# Patient Record
Sex: Female | Born: 1985 | Race: White | Hispanic: No | Marital: Married | Smoking: Current every day smoker
Health system: Southern US, Community
[De-identification: ages and names within clinical notes are randomized; demographics above are authoritative.]

## PROBLEM LIST (undated history)

## (undated) DIAGNOSIS — F603 Borderline personality disorder: Secondary | ICD-10-CM

## (undated) DIAGNOSIS — F319 Bipolar disorder, unspecified: Secondary | ICD-10-CM

## (undated) HISTORY — PX: GALLBLADDER SURGERY: SHX652

## (undated) HISTORY — DX: Borderline personality disorder: F60.3

## (undated) HISTORY — DX: Bipolar disorder, unspecified: F31.9

## (undated) HISTORY — PX: KNEE SURGERY: SHX244

---

## 2005-04-08 ENCOUNTER — Emergency Department (HOSPITAL_COMMUNITY): Admission: EM | Admit: 2005-04-08 | Discharge: 2005-04-09 | Payer: Self-pay | Admitting: Emergency Medicine

## 2009-03-19 ENCOUNTER — Emergency Department (HOSPITAL_COMMUNITY): Admission: EM | Admit: 2009-03-19 | Discharge: 2009-03-19 | Payer: Self-pay | Admitting: Emergency Medicine

## 2009-03-27 ENCOUNTER — Ambulatory Visit (HOSPITAL_COMMUNITY): Admission: RE | Admit: 2009-03-27 | Discharge: 2009-03-27 | Payer: Self-pay | Admitting: Urology

## 2010-11-23 ENCOUNTER — Emergency Department: Payer: Self-pay | Admitting: Emergency Medicine

## 2011-01-17 LAB — URINALYSIS, ROUTINE W REFLEX MICROSCOPIC
Bilirubin Urine: NEGATIVE
Ketones, ur: NEGATIVE mg/dL
Nitrite: POSITIVE — AB
Protein, ur: NEGATIVE mg/dL
pH: 6.5 (ref 5.0–8.0)

## 2011-01-17 LAB — BASIC METABOLIC PANEL
BUN: 12 mg/dL (ref 6–23)
Calcium: 9.4 mg/dL (ref 8.4–10.5)
Creatinine, Ser: 0.77 mg/dL (ref 0.4–1.2)
Glucose, Bld: 122 mg/dL — ABNORMAL HIGH (ref 70–99)
Sodium: 141 mEq/L (ref 135–145)

## 2011-01-17 LAB — URINE MICROSCOPIC-ADD ON

## 2011-01-17 LAB — DIFFERENTIAL
Basophils Absolute: 0.1 10*3/uL (ref 0.0–0.1)
Eosinophils Absolute: 0.6 10*3/uL (ref 0.0–0.7)
Lymphs Abs: 2.5 10*3/uL (ref 0.7–4.0)
Monocytes Absolute: 0.7 10*3/uL (ref 0.1–1.0)
Monocytes Relative: 8 % (ref 3–12)

## 2011-01-17 LAB — CBC
MCV: 84.1 fL (ref 78.0–100.0)
RDW: 13.9 % (ref 11.5–15.5)

## 2011-01-17 LAB — PREGNANCY, URINE: Preg Test, Ur: NEGATIVE

## 2011-01-17 LAB — POCT PREGNANCY, URINE: Preg Test, Ur: NEGATIVE

## 2011-01-17 LAB — URINE CULTURE

## 2011-02-22 NOTE — Op Note (Signed)
Allison Davis, Allison Davis              ACCOUNT NO.:  [id number]   MEDICAL RECORD NO.:  [id number]          PATIENT TYPE:  AMB   LOCATION:  DAY                          FACILITY:  [facility]   PHYSICIAN:  Mark C. Vernie Ammons, M.D.  DATE OF BIRTH:  1985-11-28   DATE OF PROCEDURE:  03/27/2009  DATE OF DISCHARGE:                               OPERATIVE REPORT   PREOPERATIVE DIAGNOSIS:  Right renal calculus.   POSTOPERATIVE DIAGNOSIS:  Right renal calculus.   PROCEDURE:  1. Cystoscopy.  2. Right ureteroscopy.  3. Laser lithotripsy.  4. Ureteroscopic stone extraction.  5. Double-J stent placement.   ANESTHESIA:  General.   SPECIMENS:  Stone given to patient.   DRAINS:  5-French, 24-cm Polaris stent (with string).   BLOOD LOSS:  Minimal.   COMPLICATIONS:  None.   INDICATIONS:  The patient is 25 year old female that was found to have a  right renal pelvic stone.  She has had this for some time and has  undergone lithotripsy of this in the past.  I was consulted via the  emergency room regarding this patient's stone and also found she had a  UTI.  At that time I planned to proceed with surgical treatment once her  UTI was cleared.  Her culture returned positive and she was treated  accordingly per sensitivities and she is brought back to the operating  room for the scheduled surgery that was planned at the time of her  emergency room visit.  The risks, complications, alternatives and  limitations were discussed.  The patient understands and elected to  proceed.   DESCRIPTION OF OPERATION:  After informed consent, the patient was  brought to major OR, placed on table, administered general anesthesia,  then moved to the dorsal lithotomy position.  Genitalia was sterilely  prepped and draped.  An official time-out was then performed.   Initially a 22-French cystoscope was passed into the bladder with a 12  degrees lens.  The bladder was fully inspected and noted to be free of  to any tumor,  stones or inflammatory lesions.  The right orifice was  identified and a 6-French open-end ureteral catheter was passed through  the cystoscope and into the right ureteral orifice.  Through this open-  ended catheter, a 0.038 inch floppy tip guidewire was then passed up the  right ureter under direct fluoroscopic control into the area of the  renal pelvis and the guidewire was left in place with the cystoscope and  ureteral catheter being removed.   The inner portion of the ureteral access sheath was initially passed  over the guidewire and into the area of the renal pelvis under  fluoroscopic control and gentle passage revealed no significant  resistance.  I then inserted the inner cannula inside the outer portion  of the access sheath and passed both of these over the guidewire again  gently passing them into the area of the renal pelvis without  difficulty.  The inner cannula and guidewire were then removed leaving  the access sheath in place.   The 6-French flexible ureteroscope was then passed through the  access  sheath into the kidney.  A systematic inspection of each calix was then  performed and all were found to be free of any stone except a single  upper to mid pole calix.  No tumors or foreign bodies were identified  within the renal  pelvis or collecting system either.   A Escape nitinol basket was then passed through the ureteroscope and I  was  able to grasp the stone and brought it to the area of the  ureteropelvic junction but it was too large to be brought through this  location.  I therefore replaced it back in the upper pole calix and  fragmented into several small fragments using the 200 micron holmium  laser fiber.  I then passed the nitinol basket through the ureteroscope  and individually grasped each of the stone fragments and extracted them  through the access sheath without difficulty.  I reinspected the kidney  and found no further stone fragments present.  I  therefore passed the  guidewire through the ureteroscope and left this in place, removing the  ureteroscope and the access sheath.   The cystoscope was back loaded over the guidewire and the stent was  passed over the guidewire in the area of the renal pelvis.  The  guidewire was removed, good curl was noted in the renal pelvis.  I then  drained the bladder, removed the cystoscope and affixed the string to  the mons pubis.  The patient was awakened and taken to recovery room in  stable and satisfactory condition.  She tolerated the procedure well and  there no intraoperative complications.  She will be maintained on her  current antibiotic regimen until  that is completed and then return to  my office for stent removal.      Mark C. Vernie Ammons, M.D.  Electronically Signed     MCO/MEDQ  D:  03/27/2009  T:  03/27/2009  Job:  [id number]

## 2011-02-22 NOTE — Consult Note (Signed)
Allison Davis, COYNE              ACCOUNT NO.:  [id number]   MEDICAL RECORD NO.:  [id number]          PATIENT TYPE:  EMS   LOCATION:                               FACILITY:  [facility]   PHYSICIAN:  Mark C. Vernie Ammons, M.D.  DATE OF BIRTH:  07-04-1986   DATE OF CONSULTATION:  03/19/2009  DATE OF DISCHARGE:                                 CONSULTATION   Emergency room consult note.   REFERRING PHYSICIAN:  Lorre Nick, M.D.   The patient is a 25 year old female patient seen at the emergency room  physician, Dr. Eliot Ford request.  The patient presented to the emergency  room with flank pain that started earlier today.  It was acute onset and  was constant in nature but persistent.  It was located the right flank  with radiation into the right anterior abdomen and is characterized as  sharp.  It was described as moderate to severe and was not aggravated or  alleviated by positional changes.  She currently denies dysuria, fever,  hematuria, nausea or vomiting.   She has a known history of a right renal calculus and in October of 2009  she had a double-J stent in place and underwent lithotripsy of the right  renal calculus without any significant fragmentation of the stone.  She  has intermittently had some mild flank pain but not severe pain.  She  also has a history of chronic recurrent UTIs that she describes as  occurring about once every month.  She oftentimes will be able to clear  these with Azo-Standard alone.   PAST MEDICAL HISTORY:  Negative for any major medical problems.   SURGICAL HISTORY:  She underwent lithotripsy of her right renal calculus  in 07/2008.  She has also had knee surgery and a splenectomy.   CURRENT MEDICATIONS:  Birth control pills and Percocet.   ALLERGIES:  No known drug allergies.   SOCIAL HISTORY:  Positive for tobacco use, no drug abuse and she  occasionally drinks alcohol.   FAMILY HISTORY:  Negative for GU malignancy or renal disease.   PHYSICAL  EXAMINATION:  Temperature is 97.6, blood pressure is 122/73,  pulse 98.  GENERAL:  The patient is a well-developed, well-nourished white female  who appears in mild discomfort.  HEENT:  Atraumatic, normocephalic.  Oropharynx clear.  NECK:  Supple with midline trachea.  CHEST:  Reveals normal respiratory effort.  CARDIOVASCULAR:  Regular rate and rhythm.  ABDOMEN:  Soft and nontender without mass or HSM.  She had mild right  CVAT to percussion.  EXTREMITIES:  Without clubbing, cyanosis or edema although she does have  multiple tattoos.  Her skin is otherwise warm and dry.  NEUROLOGICALLY:  There are no gross focal neurologic deficits.   REVIEW OF SYSTEMS:  Full 13 point review of systems was negative other  than as noted above.   LABORATORY RESULTS:  White blood cell count 9.1, hemoglobin 13.3,  hematocrit 40.3, platelets 317,000.  Her urinalysis has positive  nitrite, positive leukocyte esterase, too numerous to count white blood  cells, 0-2 red cells and many bacteria.   CT  scan images were independently reviewed.  There is a 7-mm stone in  the right renal pelvis with some slight right renal edema.  The stone  does not appear to be obstructing at this time.   IMPRESSION:  1. She appears to have an acute cystitis at this time.  This needs to      be treated with antibiotics.  2. She has a right renal calculus.  This was previously treated with      lithotripsy unsuccessfully.  We therefore discussed her treatment      options today.  Because she appears to have an acute cystitis at      this time I told her that I would not recommend ureteroscopy and      laser lithotripsy of the stone.  I could place a double-J stent and      then once the infection is clear return for ureteroscopic treatment      of her stone.  The other option, because her stone is      nonobstructing, would be to treat her with antibiotics and bring      her back for ureteroscopy once her urine is clear.   This would      allow me to treat this stone ureteroscopically as well as place a      stent for a shorter period of time since she has been bothered      significantly by the presence of a stent in the past.  She would      like to proceed with that.   PLAN:  1. She will be treated with Cipro 500 mg b.i.d. and I gave her a 2-      week course of that so that she may remain on it prior to her      surgery.  2. She is going to be scheduled for right ureteroscopy and laser      lithotripsy of her right renal pelvic calculus as an outpatient      under general anesthesia with a postoperative stent.  3. If she develops fever, chills or anything to suggest infection      prior to her surgery she will contact me immediately.      Mark C. Vernie Ammons, M.D.  Electronically Signed     MCO/MEDQ  D:  03/19/2009  T:  03/19/2009  Job:  [id number]

## 2011-10-21 DIAGNOSIS — F319 Bipolar disorder, unspecified: Secondary | ICD-10-CM | POA: Insufficient documentation

## 2011-10-21 DIAGNOSIS — F329 Major depressive disorder, single episode, unspecified: Secondary | ICD-10-CM | POA: Insufficient documentation

## 2011-10-21 DIAGNOSIS — F603 Borderline personality disorder: Secondary | ICD-10-CM | POA: Insufficient documentation

## 2011-10-21 DIAGNOSIS — J45909 Unspecified asthma, uncomplicated: Secondary | ICD-10-CM | POA: Insufficient documentation

## 2012-10-20 ENCOUNTER — Emergency Department: Payer: Self-pay | Admitting: Emergency Medicine

## 2012-10-20 LAB — CBC
HCT: 40.5 % (ref 35.0–47.0)
MCHC: 33.1 g/dL (ref 32.0–36.0)
MCV: 83 fL (ref 80–100)
Platelet: 326 10*3/uL (ref 150–440)
RBC: 4.88 10*6/uL (ref 3.80–5.20)
WBC: 12.8 10*3/uL — ABNORMAL HIGH (ref 3.6–11.0)

## 2012-10-20 LAB — URINALYSIS, COMPLETE
Glucose,UR: NEGATIVE mg/dL (ref 0–75)
Ketone: NEGATIVE
Ph: 6 (ref 4.5–8.0)
Protein: 30

## 2012-10-20 LAB — DRUG SCREEN, URINE
Amphetamines, Ur Screen: NEGATIVE (ref ?–1000)
Barbiturates, Ur Screen: NEGATIVE (ref ?–200)
Benzodiazepine, Ur Scrn: NEGATIVE (ref ?–200)
Cannabinoid 50 Ng, Ur ~~LOC~~: NEGATIVE (ref ?–50)
Opiate, Ur Screen: NEGATIVE (ref ?–300)
Tricyclic, Ur Screen: NEGATIVE (ref ?–1000)

## 2012-10-20 LAB — COMPREHENSIVE METABOLIC PANEL
Albumin: 3.8 g/dL (ref 3.4–5.0)
Anion Gap: 11 (ref 7–16)
BUN: 17 mg/dL (ref 7–18)
Chloride: 111 mmol/L — ABNORMAL HIGH (ref 98–107)
Co2: 20 mmol/L — ABNORMAL LOW (ref 21–32)
Creatinine: 1.01 mg/dL (ref 0.60–1.30)
EGFR (African American): >60
Glucose: 88 mg/dL (ref 65–99)
SGOT(AST): 24 U/L (ref 15–37)
SGPT (ALT): 36 U/L (ref 12–78)

## 2012-10-23 LAB — URINE CULTURE

## 2014-03-09 ENCOUNTER — Emergency Department: Payer: Self-pay | Admitting: Internal Medicine

## 2014-09-10 LAB — HM HIV SCREENING LAB: HM HIV Screening: NEGATIVE

## 2017-01-13 LAB — HM PAP SMEAR: HM Pap smear: NEGATIVE

## 2019-07-31 DIAGNOSIS — F603 Borderline personality disorder: Secondary | ICD-10-CM

## 2019-07-31 DIAGNOSIS — Z8742 Personal history of other diseases of the female genital tract: Secondary | ICD-10-CM

## 2019-08-05 ENCOUNTER — Ambulatory Visit: Payer: Self-pay

## 2019-08-09 ENCOUNTER — Other Ambulatory Visit: Payer: Self-pay

## 2019-08-09 ENCOUNTER — Ambulatory Visit (LOCAL_COMMUNITY_HEALTH_CENTER): Payer: Self-pay

## 2019-08-09 ENCOUNTER — Ambulatory Visit (LOCAL_COMMUNITY_HEALTH_CENTER): Payer: Medicaid Other | Admitting: Advanced Practice Midwife

## 2019-08-09 VITALS — BP 107/70 | Ht 66.0 in | Wt 160.0 lb

## 2019-08-09 DIAGNOSIS — Z30013 Encounter for initial prescription of injectable contraceptive: Secondary | ICD-10-CM | POA: Diagnosis not present

## 2019-08-09 DIAGNOSIS — Z23 Encounter for immunization: Secondary | ICD-10-CM

## 2019-08-09 DIAGNOSIS — Z3009 Encounter for other general counseling and advice on contraception: Secondary | ICD-10-CM

## 2019-08-09 MED ORDER — MEDROXYPROGESTERONE ACETATE 150 MG/ML IM SUSP
150.0000 mg | Freq: Once | INTRAMUSCULAR | Status: AC
  Administered 2019-08-09: 150 mg via INTRAMUSCULAR

## 2019-08-09 NOTE — Progress Notes (Signed)
Here today to restart Depo. Declines STD screening. Hal Morales, RN

## 2019-08-09 NOTE — Progress Notes (Signed)
Patient received Flu vaccine while here for FP OV. Hal Morales, RN

## 2019-08-09 NOTE — Progress Notes (Signed)
Pt received Depo 150mg  IM today per provider orders and pt tolerated well. Provider orders completed.Ronny Bacon, RN

## 2019-08-09 NOTE — Progress Notes (Signed)
   [place] problem visit  [place] [facility]  Subjective:  Allison Davis is a 33 y.o.G1P1 smoker being seen today for DMPA reinitiation  Chief Complaint  Patient presents with  . Contraception    Depo    HPI Last DMPA 10/24/18.  LMP 08/05/19.  Last sex 09/09/2016.  Smoker 3/4 ppd.  Last physical 05/03/18.  Last pap 01/26/17 neg  Does the patient have a current or past history of drug use? No   No components found for: HCV]   Health Maintenance Due  Topic Date Due  . HIV Screening  10/14/2000  . TETANUS/TDAP  10/14/2004  . PAP SMEAR-Modifier  10/15/2015  . INFLUENZA VACCINE  05/11/2019    ROS  The following portions of the patient's history were reviewed and updated as appropriate: allergies, current medications, past family history, past medical history, past social history, past surgical history and problem list. Problem list updated.   See flowsheet for other program required questions.  Objective:   Vitals:   08/09/19 1145  BP: 107/70  Weight: 160 lb (72.6 kg)  Height: 5\' 6"  (1.676 m)    Physical Exam  n/a  Assessment and Plan:  JOLI KOOB is a 33 y.o. female presenting to the [facility] Department for a Women's Health problem visit  1. Family planning Wants DMPA  2. Encounter for initial prescription of injectable contraceptive DMPA 150 mg IM x1 today.  Please counsel on need for backup condoms/abstinance next 7 days Needs physical     No follow-ups on file.  No future appointments.  Herbie Saxon, CNM

## 2019-10-09 ENCOUNTER — Ambulatory Visit: Payer: Medicaid Other | Attending: Internal Medicine

## 2019-10-09 DIAGNOSIS — Z20822 Contact with and (suspected) exposure to covid-19: Secondary | ICD-10-CM

## 2019-10-10 LAB — NOVEL CORONAVIRUS, NAA: SARS-CoV-2, NAA: NOT DETECTED

## 2019-12-10 ENCOUNTER — Ambulatory Visit: Payer: Medicaid Other

## 2020-03-11 ENCOUNTER — Ambulatory Visit (INDEPENDENT_AMBULATORY_CARE_PROVIDER_SITE_OTHER): Payer: Self-pay | Admitting: Family Medicine

## 2020-03-11 ENCOUNTER — Other Ambulatory Visit: Payer: Self-pay

## 2020-03-11 ENCOUNTER — Other Ambulatory Visit: Payer: Self-pay | Admitting: Family Medicine

## 2020-03-11 ENCOUNTER — Encounter: Payer: Self-pay | Admitting: Family Medicine

## 2020-03-11 VITALS — BP 114/43 | HR 72 | Temp 97.8°F | Ht 63.5 in | Wt 154.6 lb

## 2020-03-11 DIAGNOSIS — Z8041 Family history of malignant neoplasm of ovary: Secondary | ICD-10-CM

## 2020-03-11 DIAGNOSIS — N6310 Unspecified lump in the right breast, unspecified quadrant: Secondary | ICD-10-CM

## 2020-03-11 DIAGNOSIS — R3129 Other microscopic hematuria: Secondary | ICD-10-CM

## 2020-03-11 DIAGNOSIS — Z0000 Encounter for general adult medical examination without abnormal findings: Secondary | ICD-10-CM

## 2020-03-11 DIAGNOSIS — Z1329 Encounter for screening for other suspected endocrine disorder: Secondary | ICD-10-CM

## 2020-03-11 DIAGNOSIS — Z13220 Encounter for screening for lipoid disorders: Secondary | ICD-10-CM

## 2020-03-11 DIAGNOSIS — N644 Mastodynia: Secondary | ICD-10-CM

## 2020-03-11 DIAGNOSIS — Z803 Family history of malignant neoplasm of breast: Secondary | ICD-10-CM

## 2020-03-11 DIAGNOSIS — Z7689 Persons encountering health services in other specified circumstances: Secondary | ICD-10-CM | POA: Insufficient documentation

## 2020-03-11 LAB — POCT URINALYSIS DIPSTICK
Bilirubin, UA: NEGATIVE
Blood, UA: NEGATIVE
Glucose, UA: NEGATIVE
Ketones, UA: NEGATIVE
Leukocytes, UA: NEGATIVE
Nitrite, UA: NEGATIVE
Protein, UA: NEGATIVE
Spec Grav, UA: <=1.005 — AB (ref 1.010–1.025)
Urobilinogen, UA: 0.2 E.U./dL
pH, UA: 7 (ref 5.0–8.0)

## 2020-03-11 LAB — POCT URINE PREGNANCY: Preg Test, Ur: NEGATIVE

## 2020-03-11 NOTE — Progress Notes (Signed)
Updated order code for mammo

## 2020-03-11 NOTE — Assessment & Plan Note (Signed)
New patient establishment with [facility] on 03/11/2020.  With concerns for right breast mass with bilateral breast pain x 2-3 months.  Reports family history of ovarian and breast cancer, denies any past history of genetic testing.  Upon wrapping up of visit and ordering tests/labs, patient with family planning medicaid.  Discussed with patient options including [facility] and provided with contact information.  Discussed if does not qualify for this clinic to please let us know and I will request assistance with our social work team to get care coordinated and if needs assistance with updating medicaid card.  Plan: 1. Diagnostic mammogram, and bilateral ultrasounds ordered 2. Labs ordered for evaluation. 3. Referral to OBGYN placed to establish care 4. To return to clinic in 4 weeks

## 2020-03-11 NOTE — Patient Instructions (Addendum)
I have put in orders for a diagnostic mammogram as well as a right and left breast ultrasound for evaluation of your right breast mass.    Call the Imaging Center below anytime to schedule your own appointment now that order has been placed.  [facility] [address] [place], [place] [address] Phone: [phone]  [facility] [address] [place], [place] [address] Phone: [phone]  Be sure to contact your Medicaid social worker to update your medicaid card, not having this updated may cause a delay in scheduling of appointments.  Can contact [facility] at [address], [place], [place] [phone]  Have your labs drawn in the next 1-2 weeks and I will contact you with the results to discuss.  I have put in a referral to OBGYN for you to establish for well woman care given your family history of ovarian and breast cancer.  If you have not heard from them within 1 week to please contact our office and I will reach out to the referral coordinator for an update.  We will plan to see you back in 4 weeks for breast discomfort/mass follow up  You will receive a survey after today's visit either digitally by e-mail or paper by USPS mail. Your experiences and feedback matter to [place].  Please respond so we know how we are doing as we provide care for you.  Call us with any questions/concerns/needs.  It is my goal to be available to you for your health concerns.  Thanks for choosing me to be a partner in your healthcare needs!  Charlaine Dalton, FNP-C Family Nurse Practitioner [facility] Phone: [phone]

## 2020-03-11 NOTE — Progress Notes (Signed)
Subjective:    Patient ID: Allison Davis, female    DOB: [birth year], 34 y.o.   MRN: [id number]  Allison Davis is a 34 y.o. female presenting on 03/11/2020 for Establish Care (bilateral soreness and pain both breast x 2-3  mths. The pain worsen around the areola .)   HPI  Reports no history of establishment with a primary care provider in the past.  Records unable to be requested.  Has acute concerns today for bilateral breast pain/discomfort with right breast mass.  States has remained constant over the past 2-3 months.  Had recently followed with [facility] with her last visit in October 2020 having received depo-provera.  Reports that she has not followed up with them and has been having spotting without full menses.  Denies any concerns for pregnancy.  Denies galactorrhea, skin changes, worsening of right breast mass since initially finding it 2-3 months ago.  No flowsheet data found.  Past Medical History:  Diagnosis Date  . Bipolar disorder (HCC)   . Borderline personality disorder [facility])    Past Surgical History:  Procedure Laterality Date  . GALLBLADDER SURGERY    . KNEE SURGERY Left    Social History   Socioeconomic History  . Marital status: Married    Spouse name: Not on file  . Number of children: Not on file  . Years of education: Not on file  . Highest education level: Not on file  Occupational History  . Not on file  Tobacco Use  . Smoking status: Current Every Day Smoker    Packs/day: 1.00    Years: 20.00    Pack years: 20.00  . Smokeless tobacco: Never Used  Substance and Sexual Activity  . Alcohol use: Never  . Drug use: Never  . Sexual activity: Not on file  Other Topics Concern  . Not on file  Social History Narrative  . Not on file   Social Determinants of Health   Financial Resource Strain:   . Difficulty of Paying Living Expenses:   Food Insecurity:   . Worried About Programme researcher, broadcasting/film/video in the Last Year:   . Occupational psychologist in the Last Year:   Transportation Needs:   . Freight forwarder (Medical):   Marland Kitchen Lack of Transportation (Non-Medical):   Physical Activity:   . Days of Exercise per Week:   . Minutes of Exercise per Session:   Stress:   . Feeling of Stress :   Social Connections:   . Frequency of Communication with Friends and Family:   . Frequency of Social Gatherings with Friends and Family:   . Attends Religious Services:   . Active Member of Clubs or Organizations:   . Attends Banker Meetings:   Marland Kitchen Marital Status:   Intimate Partner Violence:   . Fear of Current or Ex-Partner:   . Emotionally Abused:   Marland Kitchen Physically Abused:   . Sexually Abused:    Family History  Problem Relation Age of Onset  . Cancer Mother        ovarian cancer  . ADD / ADHD Son   . Hypertension Maternal Grandmother   . Lung cancer Maternal Grandfather   . Lung cancer Maternal Uncle   . Breast cancer Maternal Aunt        Maternal GREAT Aunt    No current outpatient medications on file prior to visit.   No current facility-administered medications on file prior to visit.  Per HPI unless specifically indicated above     Objective:    BP (!) 114/43 (BP Location: Right Arm, Patient Position: Sitting, Cuff Size: Normal)   Pulse 72   Temp 97.8 F (36.6 C) (Temporal)   Ht 5' 3.5" (1.613 m)   Wt 154 lb 9.6 oz (70.1 kg)   BMI 26.96 kg/m   Wt Readings from Last 3 Encounters:  03/11/20 154 lb 9.6 oz (70.1 kg)  08/09/19 160 lb (72.6 kg)  10/24/18 170 lb (77.1 kg)  Physical Exam Vitals reviewed.  Constitutional:      General: She is not in acute distress.    Appearance: Normal appearance. She is well-developed, well-groomed and overweight. She is not ill-appearing or toxic-appearing.  HENT:     Head: Normocephalic.     Nose:     Comments: Lesia Sago is in place, covering mouth and nose  Eyes:     General: Lids are normal. Vision grossly intact.        Right eye: No discharge.          Left eye: No discharge.     Extraocular Movements: Extraocular movements intact.     Conjunctiva/sclera: Conjunctivae normal.     Pupils: Pupils are equal, round, and reactive to light.  Cardiovascular:     Rate and Rhythm: Normal rate and regular rhythm.     Pulses: Normal pulses.     Heart sounds: Normal heart sounds. No murmur. No friction rub. No gallop.   Pulmonary:     Effort: Pulmonary effort is normal. No respiratory distress.     Breath sounds: Normal breath sounds.  Chest:     Chest wall: No mass.     Breasts: Breasts are symmetrical.        Right: Mass present. No swelling, bleeding, inverted nipple, nipple discharge, skin change or tenderness.        Left: Normal.    Musculoskeletal:     Right lower leg: No edema.     Left lower leg: No edema.  Lymphadenopathy:     Upper Body:     Right upper body: No supraclavicular or axillary adenopathy.     Left upper body: No supraclavicular or axillary adenopathy.  Skin:    General: Skin is warm and dry.     Capillary Refill: Capillary refill takes less than 2 seconds.  Neurological:     General: No focal deficit present.     Mental Status: She is alert and oriented to person, place, and time.     Cranial Nerves: No cranial nerve deficit.     Sensory: No sensory deficit.     Motor: No weakness.     Coordination: Coordination normal.     Gait: Gait normal.  Psychiatric:        Attention and Perception: Attention and perception normal.        Mood and Affect: Mood and affect normal.        Speech: Speech normal.        Behavior: Behavior normal. Behavior is cooperative.        Thought Content: Thought content normal.        Cognition and Memory: Cognition and memory normal.        Judgment: Judgment normal.     Results for orders placed or performed in visit on 03/11/20  POCT urine pregnancy  Result Value Ref Range   Preg Test, Ur Negative Negative  POCT Urinalysis Dipstick  Result Value Ref Range  Color, UA  Yellow    Clarity, UA clear    Glucose, UA Negative Negative   Bilirubin, UA negative    Ketones, UA negative    Spec Grav, UA <=1.005 (A) 1.010 - 1.025   Blood, UA negative    pH, UA 7.0 5.0 - 8.0   Protein, UA Negative Negative   Urobilinogen, UA 0.2 0.2 or 1.0 E.U./dL   Nitrite, UA negative    Leukocytes, UA Negative Negative   Appearance     Odor        Assessment & Plan:   Problem List Items Addressed This Visit      Other   Breast mass, right   Relevant Orders   US BREAST LTD UNI RIGHT INC AXILLA   MM Digital Diagnostic Bilat   Ambulatory referral to Obstetrics / Gynecology   Encounter to establish care with new doctor    New patient establishment with [facility] on 03/11/2020.  With concerns for right breast mass with bilateral breast pain x 2-3 months.  Reports family history of ovarian and breast cancer, denies any past history of genetic testing.  Upon wrapping up of visit and ordering tests/labs, patient with family planning medicaid.  Discussed with patient options including [facility] and provided with contact information.  Discussed if does not qualify for this clinic to please let us know and I will request assistance with our social work team to get care coordinated and if needs assistance with updating medicaid card.  Plan: 1. Diagnostic mammogram, and bilateral ultrasounds ordered 2. Labs ordered for evaluation. 3. Referral to OBGYN placed to establish care 4. To return to clinic in 4 weeks       Other Visit Diagnoses    Breast tenderness in female    -  Primary   Relevant Orders   POCT urine pregnancy (Completed)   US BREAST LTD UNI RIGHT INC AXILLA   US BREAST LTD UNI LEFT INC AXILLA   MM Digital Diagnostic Bilat   Ambulatory referral to Obstetrics / Gynecology   Routine medical exam       Relevant Orders   POCT Urinalysis Dipstick (Completed)   CBC with Differential   COMPLETE METABOLIC PANEL WITH GFR   Encounter for  screening for lipid disorder       Relevant Orders   Lipid Profile   Screening for thyroid disorder       Relevant Orders   Thyroid Panel With TSH   Family history of malignant neoplasm of female breast       Relevant Orders   Ambulatory referral to Obstetrics / Gynecology   Family history of ovarian cancer       Relevant Orders   Ambulatory referral to Obstetrics / Gynecology   Microscopic hematuria       Relevant Orders   Urinalysis, microscopic only      No orders of the defined types were placed in this encounter.     Follow up plan: Return in about 4 weeks (around 04/08/2020) for Breast discomfort follow up.  Harlin Rain, FNP-C Family Nurse Practitioner [facility] 03/11/2020, 11:52 AM

## 2020-03-16 ENCOUNTER — Other Ambulatory Visit: Payer: Medicaid Other

## 2020-03-23 ENCOUNTER — Ambulatory Visit
Admission: RE | Admit: 2020-03-23 | Discharge: 2020-03-23 | Disposition: A | Discharge status: Home / Self Care | Payer: Medicaid Other | Source: Ambulatory Visit | Attending: Family Medicine | Admitting: Family Medicine

## 2020-03-23 DIAGNOSIS — N6310 Unspecified lump in the right breast, unspecified quadrant: Secondary | ICD-10-CM

## 2020-03-23 DIAGNOSIS — N644 Mastodynia: Secondary | ICD-10-CM | POA: Insufficient documentation

## 2020-03-31 ENCOUNTER — Encounter: Payer: Self-pay | Admitting: Obstetrics and Gynecology

## 2020-04-08 ENCOUNTER — Ambulatory Visit: Payer: Medicaid Other | Admitting: Family Medicine

## 2020-08-04 ENCOUNTER — Other Ambulatory Visit: Payer: Self-pay

## 2020-08-04 ENCOUNTER — Ambulatory Visit (LOCAL_COMMUNITY_HEALTH_CENTER): Payer: Medicaid Other

## 2020-08-04 DIAGNOSIS — Z23 Encounter for immunization: Secondary | ICD-10-CM

## 2020-08-04 NOTE — Progress Notes (Signed)
Per client, had "shot for whooping cough" before she left the hospital 05/2011 following birth of her last child. Jossie Ng, RN

## 2020-10-08 ENCOUNTER — Ambulatory Visit: Payer: Medicaid Other

## 2020-10-21 ENCOUNTER — Ambulatory Visit: Payer: Medicaid Other

## 2020-10-22 ENCOUNTER — Ambulatory Visit: Payer: Medicaid Other

## 2020-11-05 ENCOUNTER — Ambulatory Visit: Payer: Medicaid Other

## 2021-11-20 IMAGING — US US BREAST*R* LIMITED INC AXILLA
1 series · 4 of 4 positions shown · non-contrast
Comparison: None.

CLINICAL DATA: Patient presents for evaluation of palpable
abnormalities within the right breast.

EXAM:
DIGITAL DIAGNOSTIC BILATERAL MAMMOGRAM WITH CAD AND TOMO
ULTRASOUND BILATERAL BREAST

[Series 1: us breast*right* limited inc axilla · 0.06mm/px · 4 of 4 slices shown]
[im 1/4]
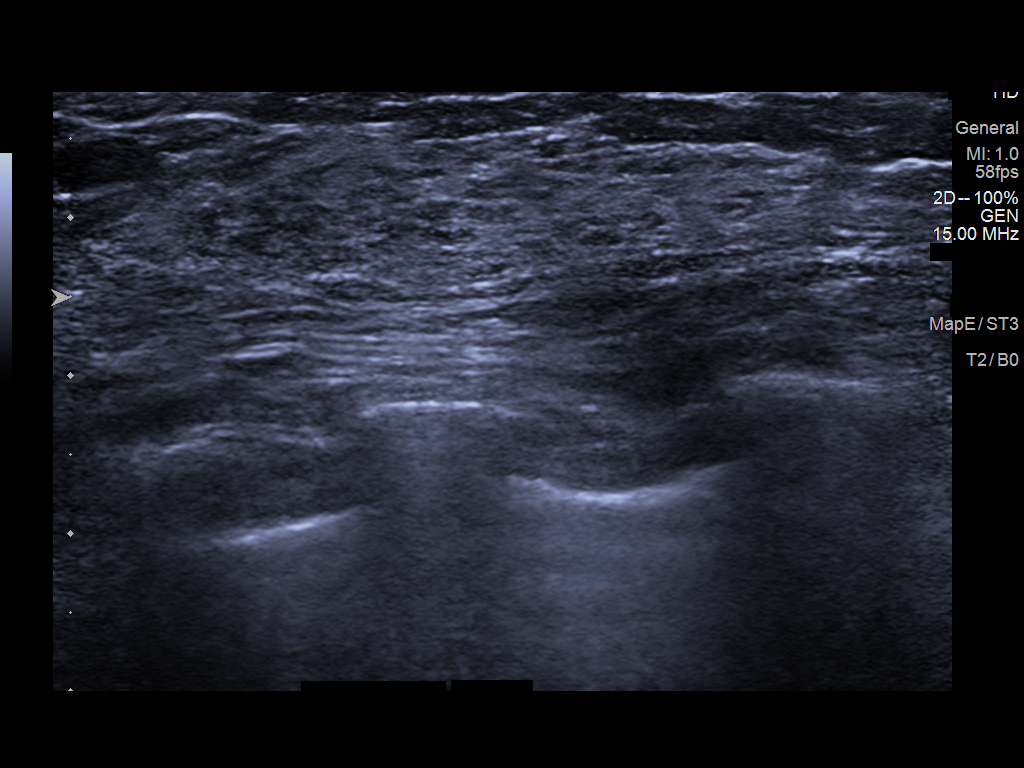
[im 2/4]
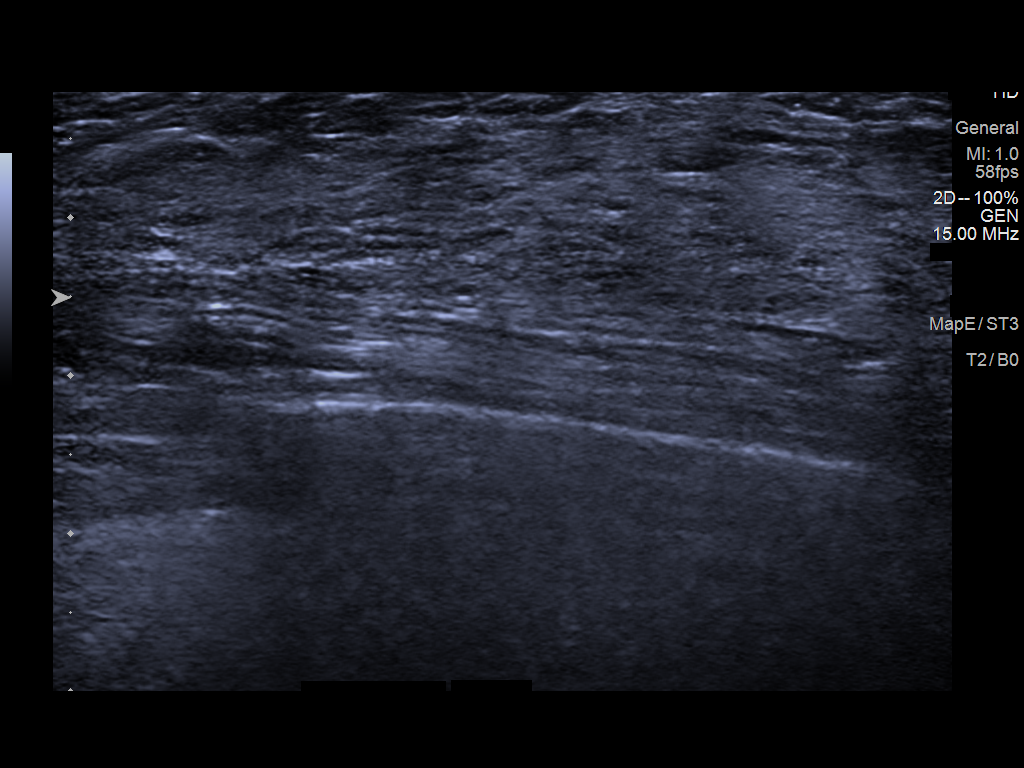
[im 3/4]
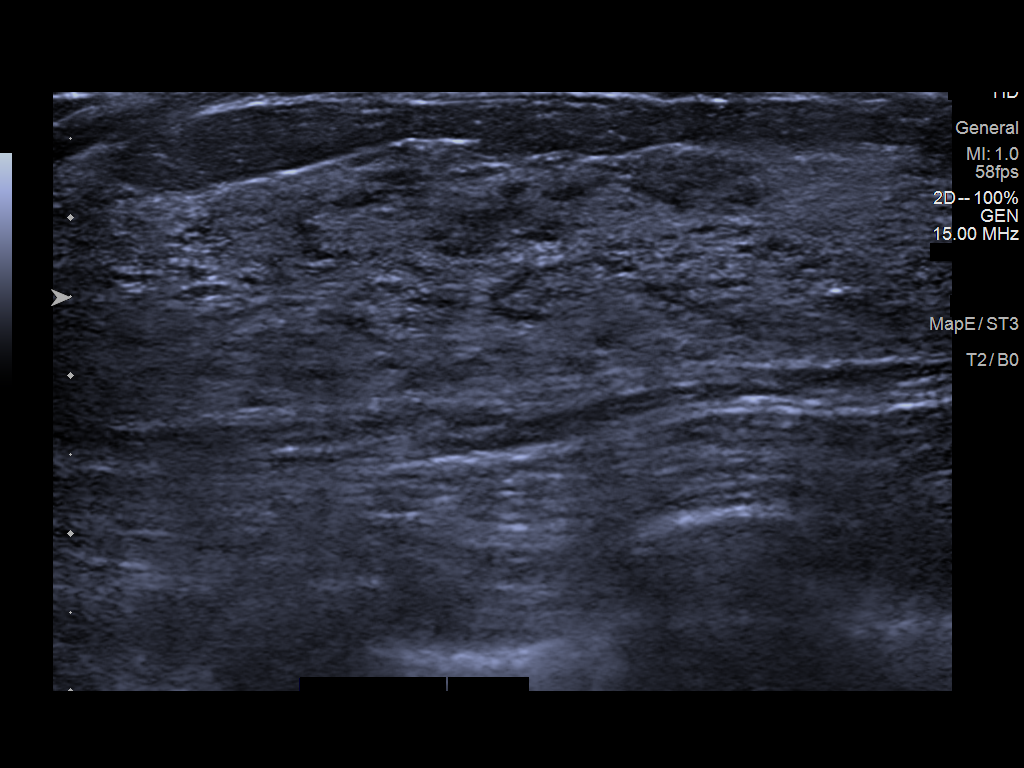
[im 4/4]
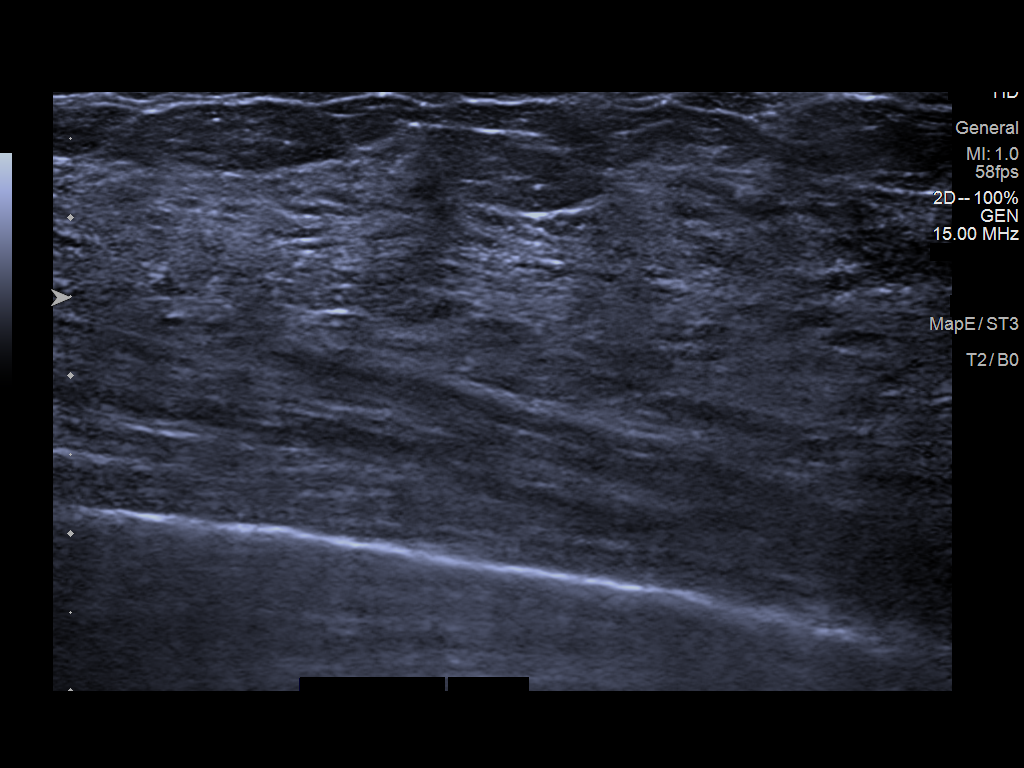

[4 of 4 positions shown; findings below may reference images not displayed]

ACR Breast Density Category c: The breast tissue is heterogeneously
dense, which may obscure small masses.
FINDINGS: No concerning masses, calcifications or distortion identified within
the right breast.

Within the upper-outer left breast near the left axilla there is an
oval mass.

Mammographic images were processed with CAD.

Targeted ultrasound is performed, showing normal dense tissue
without suspicious mass at the site of palpable concern right breast
10 o'clock position 6 cm from nipple and right breast 10:30 o'clock
1 cm from nipple.

Normal appearing lymph nodes within the left axilla.
IMPRESSION: No mammographic evidence for malignancy.

No suspicious abnormality at the sites of palpable concern right
breast.

RECOMMENDATION:
Continued clinical evaluation for right breast palpable areas of
concern.

Return to annual screening mammography at age 40.

I have discussed the findings and recommendations with the patient.
If applicable, a reminder letter will be sent to the patient
regarding the next appointment.

BI-RADS CATEGORY  2: Benign.

## 2021-11-20 IMAGING — US US BREAST*L* LIMITED INC AXILLA
2 series · 8 of 8 positions shown · non-contrast
Comparison: None.

CLINICAL DATA: Patient presents for evaluation of palpable
abnormalities within the right breast.

EXAM:
DIGITAL DIAGNOSTIC BILATERAL MAMMOGRAM WITH CAD AND TOMO
ULTRASOUND BILATERAL BREAST

[Series 1: us breast*left* limited inc axilla · 0.06mm/px · 6 of 6 slices shown (1 of 2)]
[im 1/6]
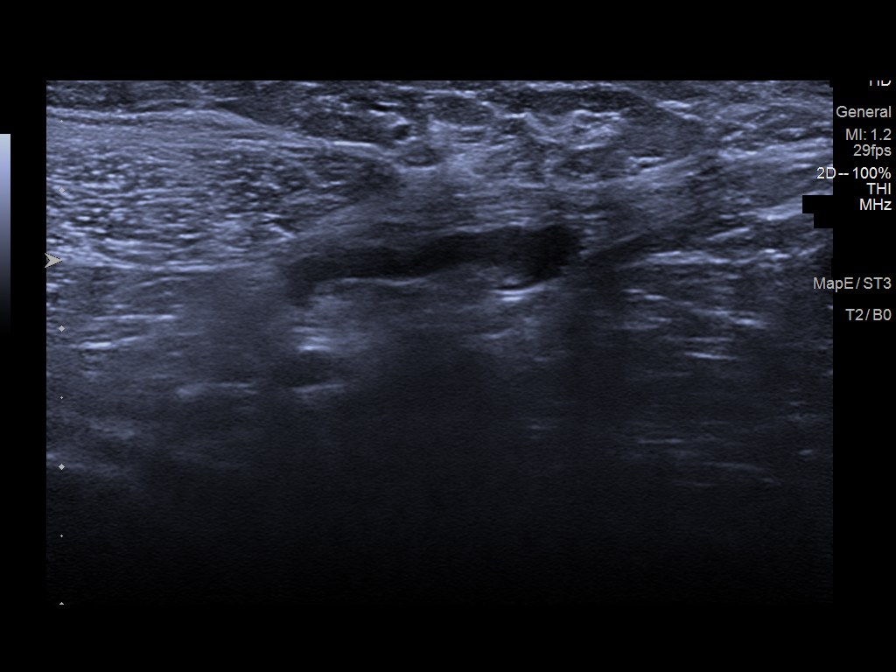
[im 2/6]
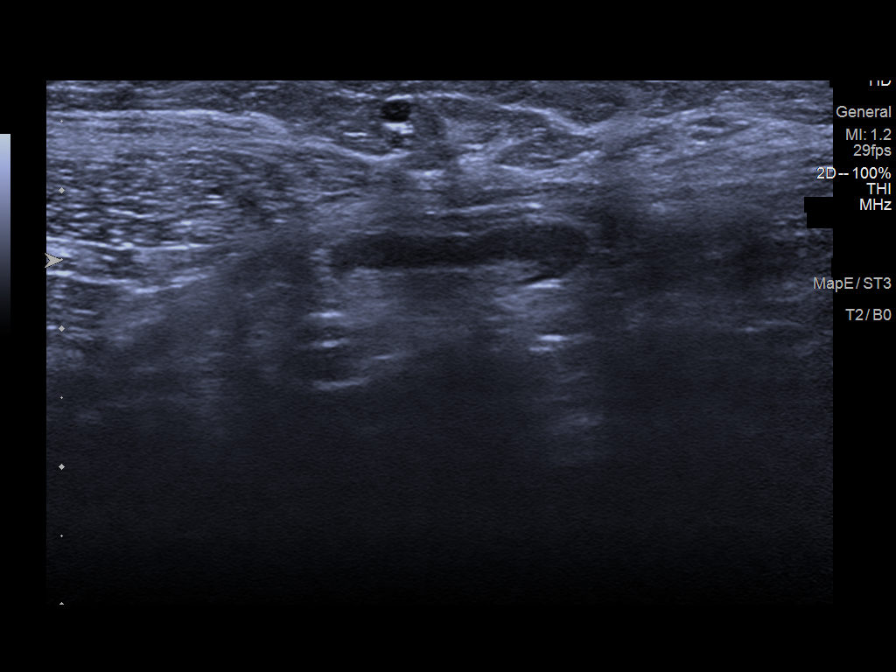
[im 3/6]
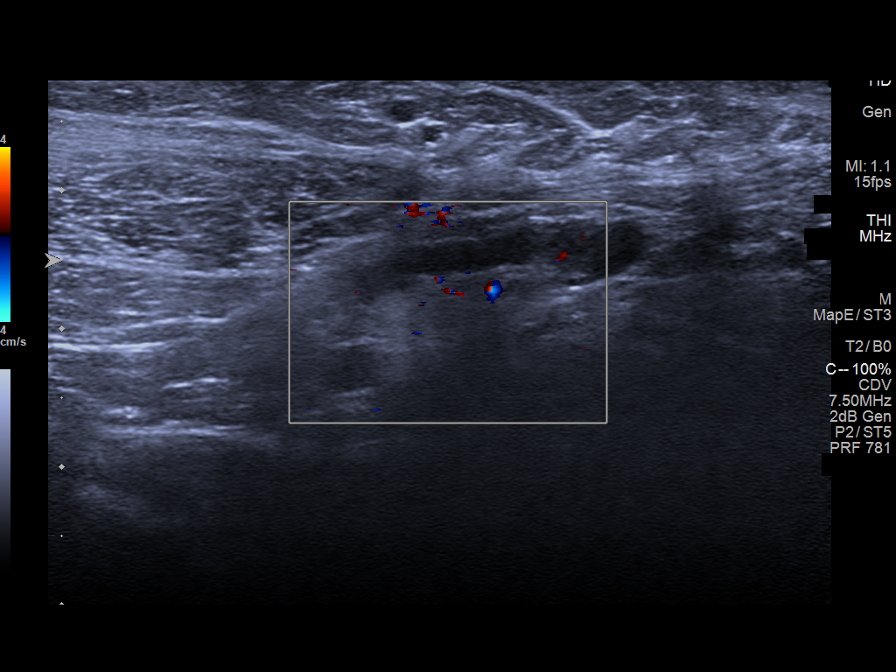
[im 4/6]
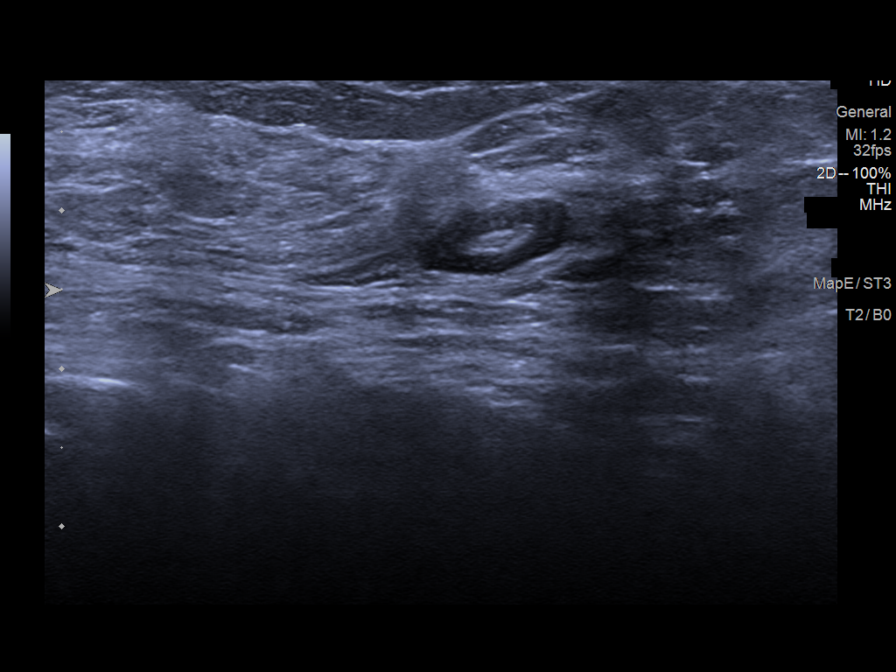
[im 5/6]
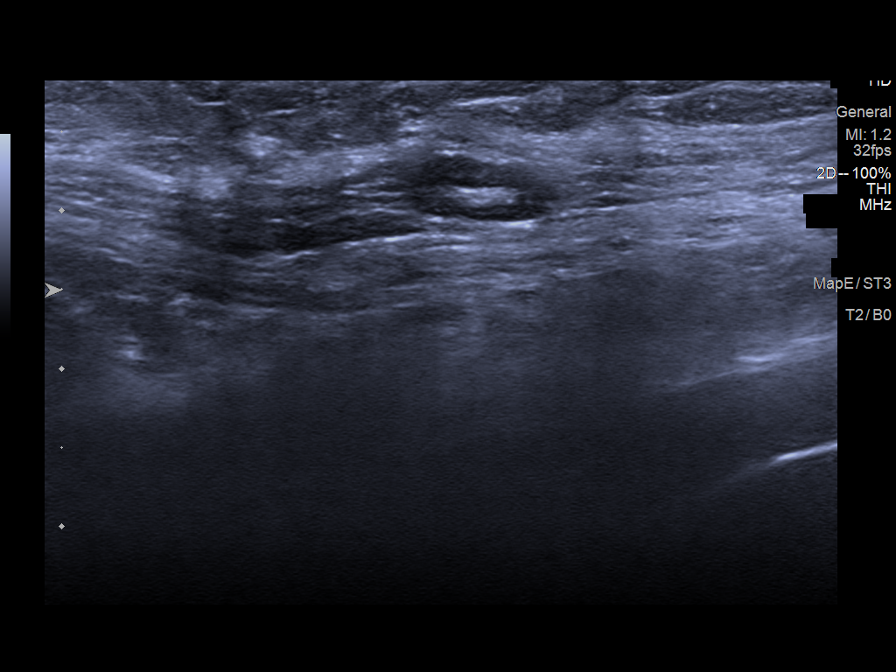
[im 6/6]
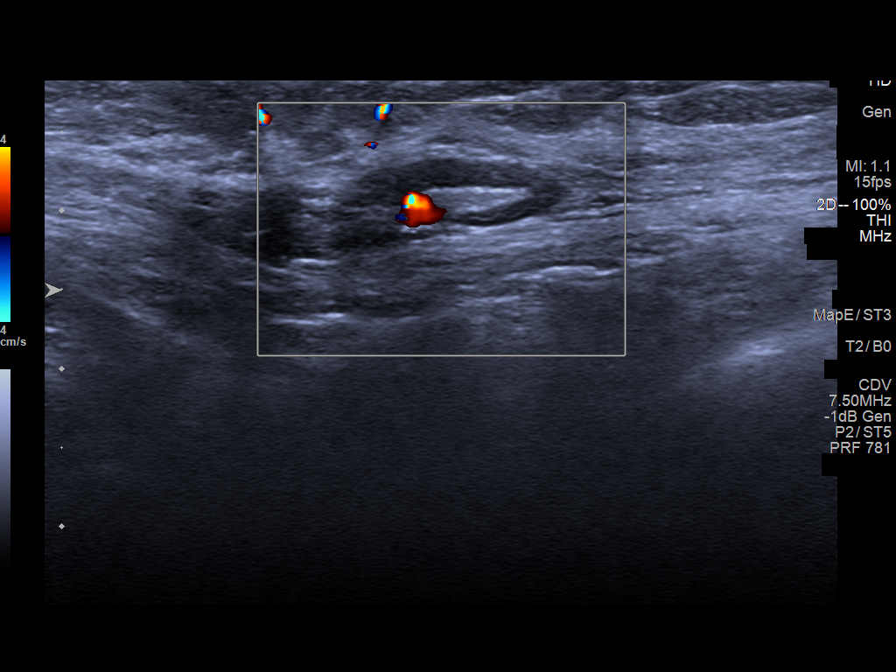

[Series 2: us breast*left* limited inc axilla · 0.06mm/px · 2 of 2 slices shown (2 of 2)]
[im 1/2]
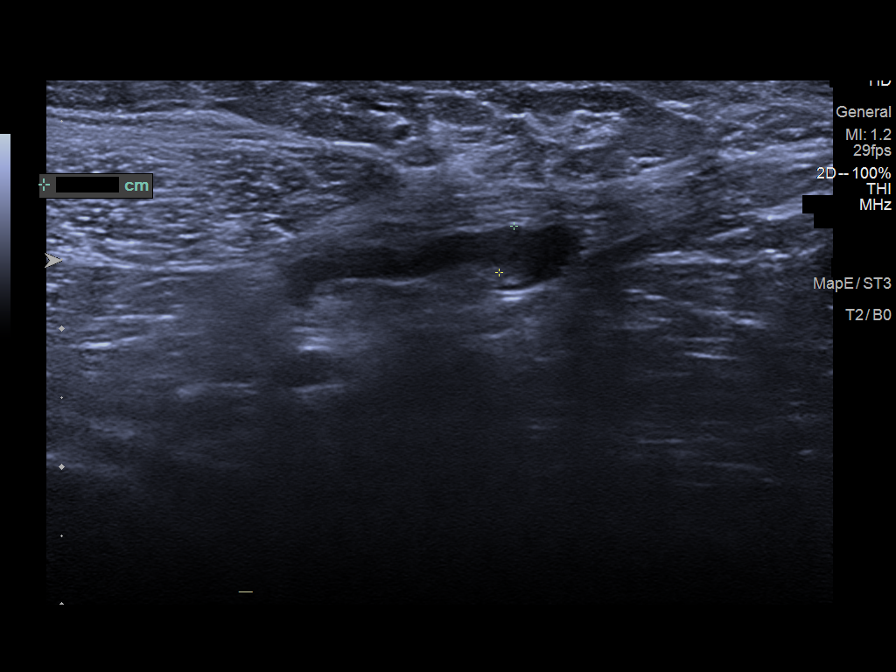
[im 2/2]
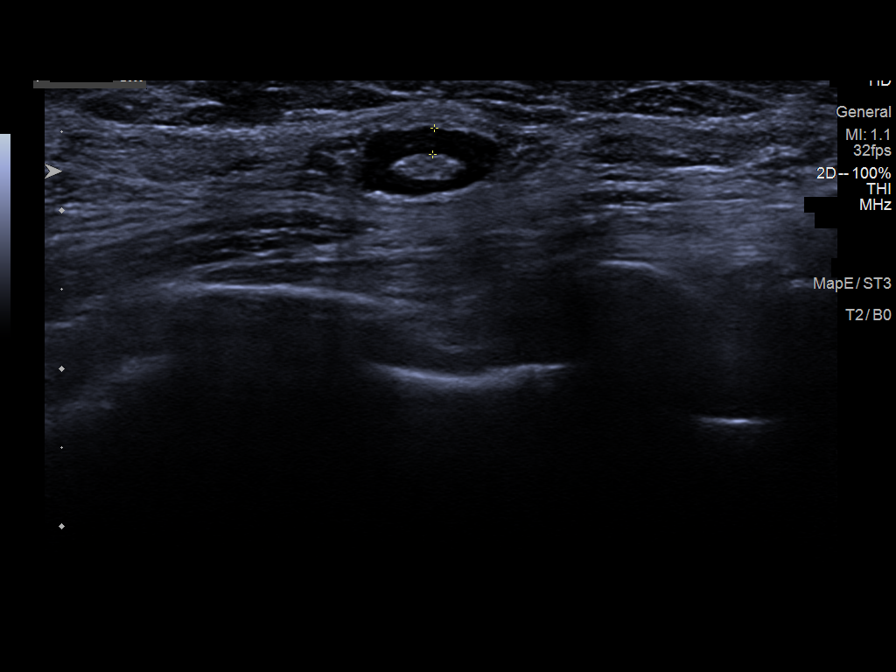

[8 of 8 positions shown; findings below may reference images not displayed]

ACR Breast Density Category c: The breast tissue is heterogeneously
dense, which may obscure small masses.
FINDINGS: No concerning masses, calcifications or distortion identified within
the right breast.

Within the upper-outer left breast near the left axilla there is an
oval mass.

Mammographic images were processed with CAD.

Targeted ultrasound is performed, showing normal dense tissue
without suspicious mass at the site of palpable concern right breast
10 o'clock position 6 cm from nipple and right breast 10:30 o'clock
1 cm from nipple.

Normal appearing lymph nodes within the left axilla.
IMPRESSION: No mammographic evidence for malignancy.

No suspicious abnormality at the sites of palpable concern right
breast.

RECOMMENDATION:
Continued clinical evaluation for right breast palpable areas of
concern.

Return to annual screening mammography at age 40.

I have discussed the findings and recommendations with the patient.
If applicable, a reminder letter will be sent to the patient
regarding the next appointment.

BI-RADS CATEGORY  2: Benign.

## 2021-11-20 IMAGING — MG DIGITAL DIAGNOSTIC BILAT W/ TOMO W/ CAD
6 of 12 series · 6 of 36 positions shown · non-contrast
Comparison: None.

CLINICAL DATA: Patient presents for evaluation of palpable
abnormalities within the right breast.

EXAM:
DIGITAL DIAGNOSTIC BILATERAL MAMMOGRAM WITH CAD AND TOMO
ULTRASOUND BILATERAL BREAST

[L MLO synth-2D]
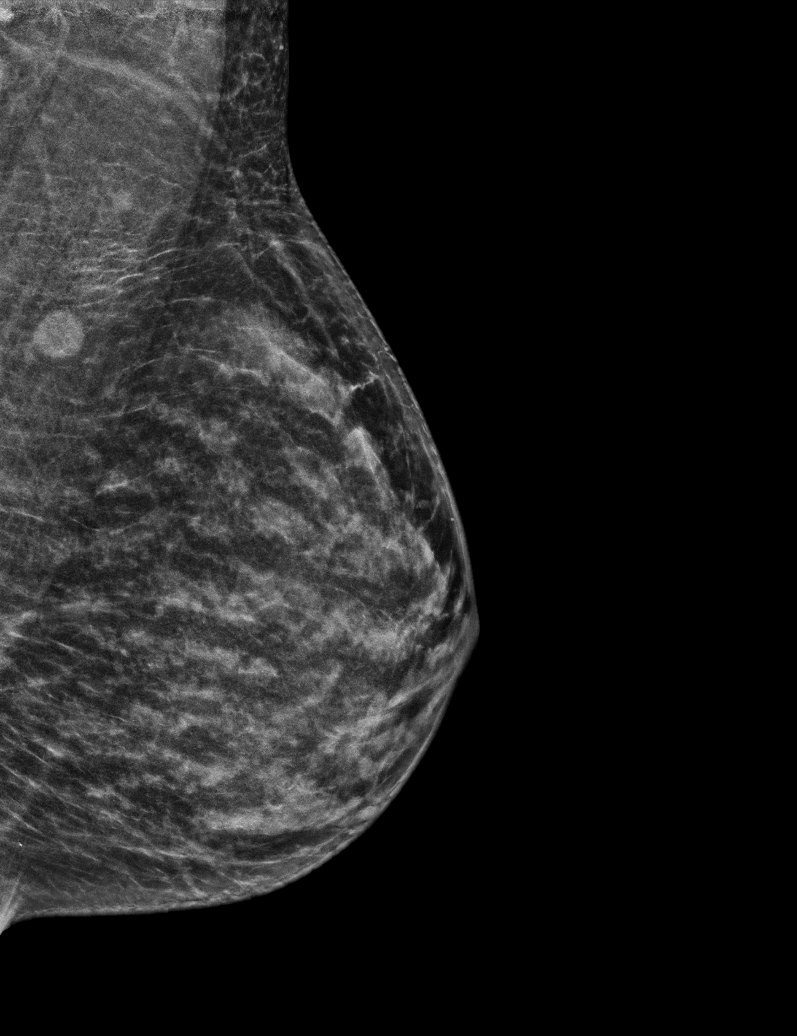

[R TAN synth-2D (1 of 2)]
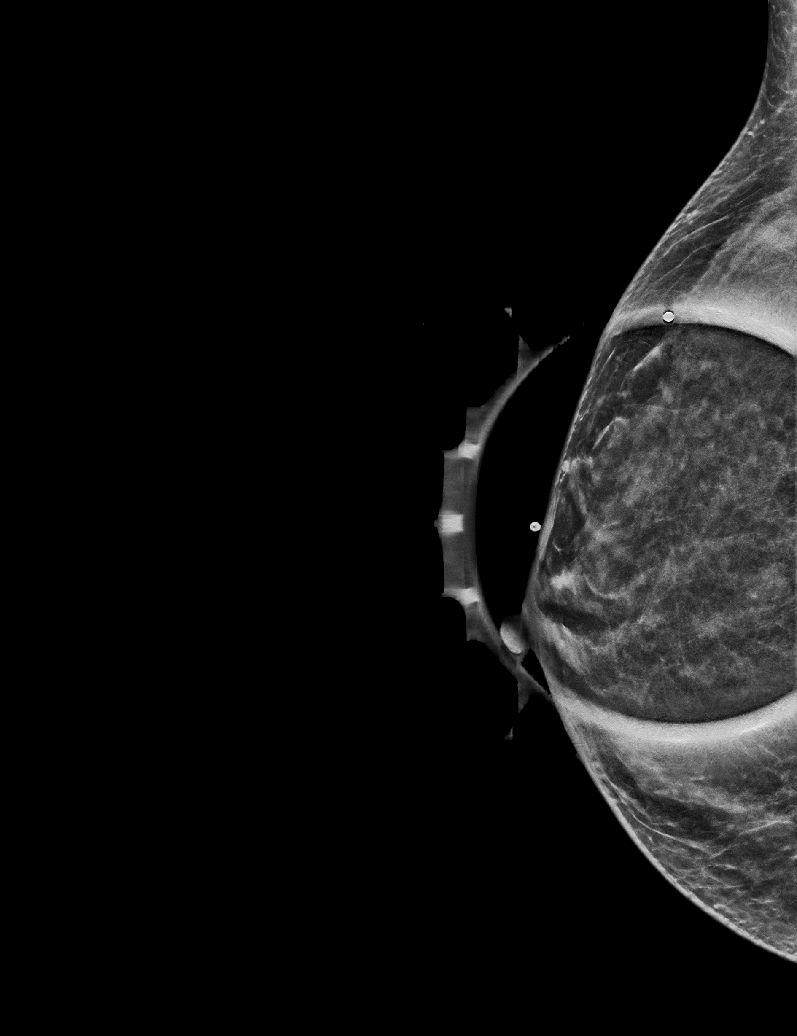

[R TAN synth-2D (2 of 2)]
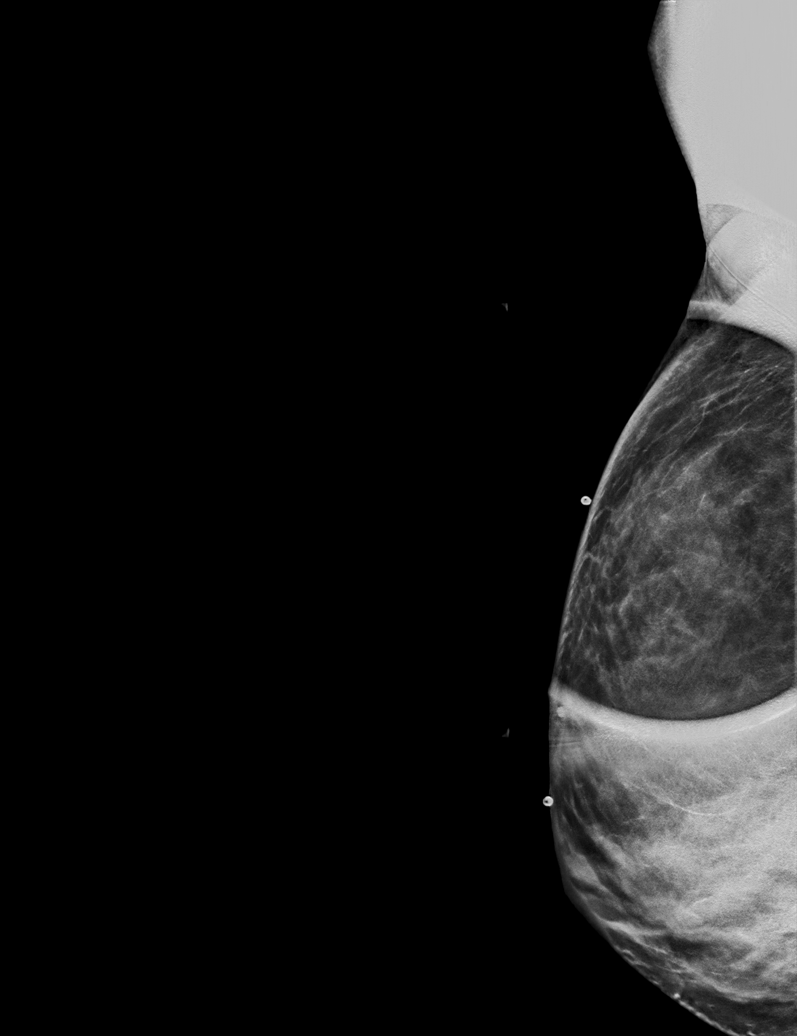

[R CC synth-2D]
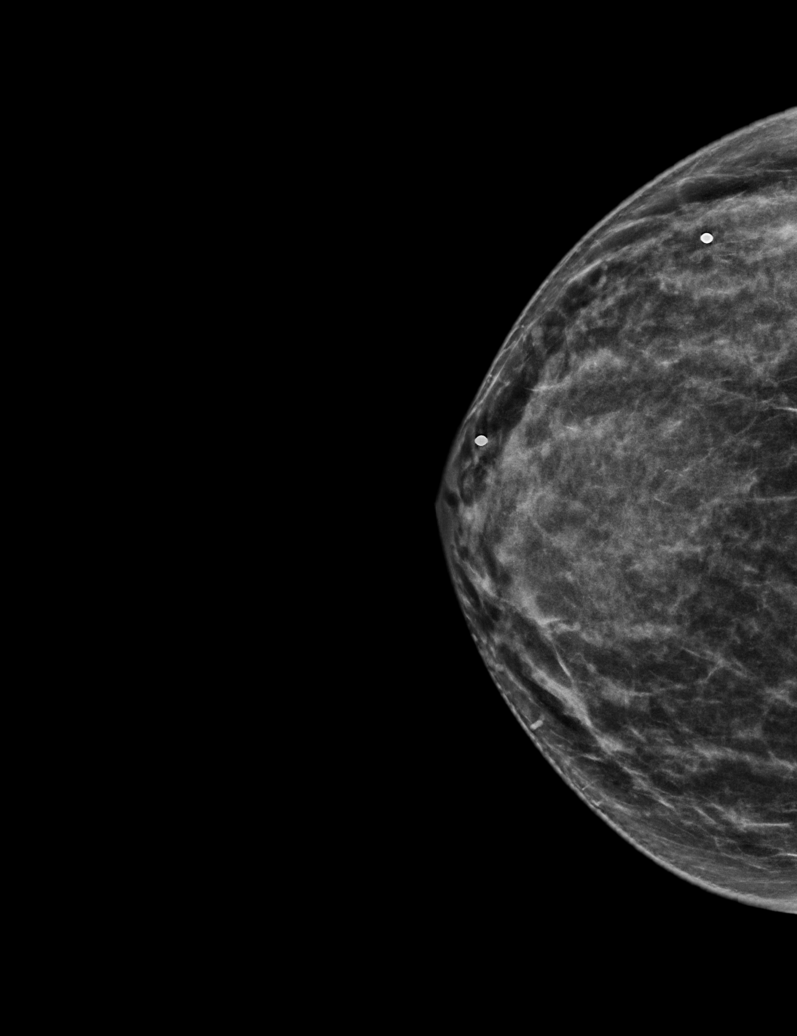

[R MLO synth-2D]
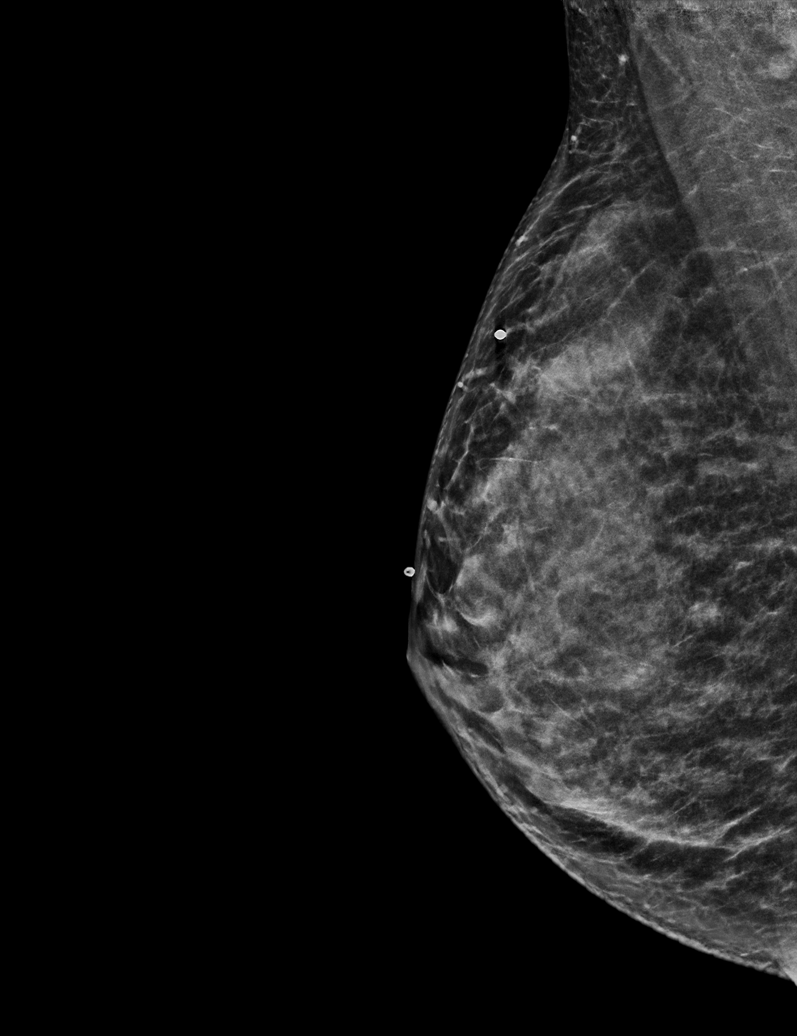

[L CC synth-2D]
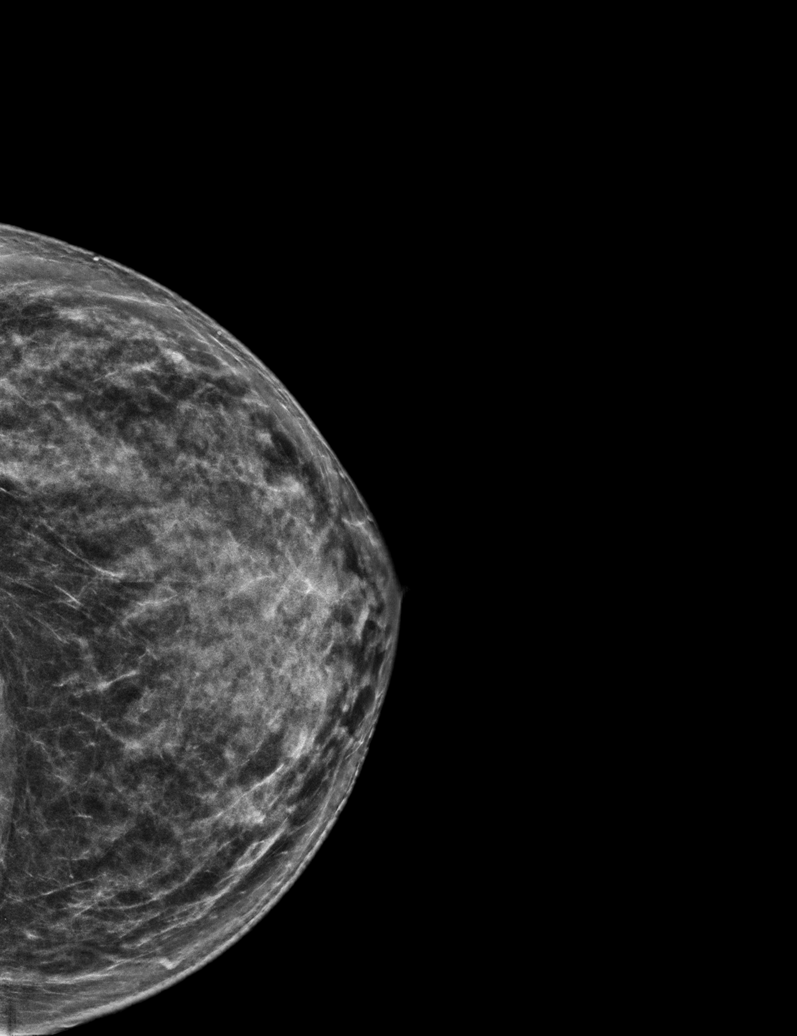

[6 of 36 positions shown; findings below may reference images not displayed]

ACR Breast Density Category c: The breast tissue is heterogeneously
dense, which may obscure small masses.
FINDINGS: No concerning masses, calcifications or distortion identified within
the right breast.

Within the upper-outer left breast near the left axilla there is an
oval mass.

Mammographic images were processed with CAD.

Targeted ultrasound is performed, showing normal dense tissue
without suspicious mass at the site of palpable concern right breast
10 o'clock position 6 cm from nipple and right breast 10:30 o'clock
1 cm from nipple.

Normal appearing lymph nodes within the left axilla.
IMPRESSION: No mammographic evidence for malignancy.

No suspicious abnormality at the sites of palpable concern right
breast.

RECOMMENDATION:
Continued clinical evaluation for right breast palpable areas of
concern.

Return to annual screening mammography at age 40.

I have discussed the findings and recommendations with the patient.
If applicable, a reminder letter will be sent to the patient
regarding the next appointment.

BI-RADS CATEGORY  2: Benign.
